# Patient Record
Sex: Female | Born: 1965 | Race: White | Hispanic: No | Marital: Single | Smoking: Never smoker
Health system: Southern US, Community
[De-identification: ages and names within clinical notes are randomized; demographics above are authoritative.]

## PROBLEM LIST (undated history)

## (undated) DIAGNOSIS — F419 Anxiety disorder, unspecified: Secondary | ICD-10-CM

## (undated) HISTORY — DX: Anxiety disorder, unspecified: F41.9

## (undated) HISTORY — PX: CHOLECYSTECTOMY: SHX55

## (undated) HISTORY — PX: BREAST CYST ASPIRATION: SHX578

---

## 1999-04-09 ENCOUNTER — Inpatient Hospital Stay (HOSPITAL_COMMUNITY): Admission: AD | Admit: 1999-04-09 | Discharge: 1999-04-12 | Payer: Self-pay | Admitting: Obstetrics and Gynecology

## 1999-05-24 ENCOUNTER — Other Ambulatory Visit: Admission: RE | Admit: 1999-05-24 | Discharge: 1999-05-24 | Payer: Self-pay | Admitting: Obstetrics and Gynecology

## 2000-08-25 ENCOUNTER — Other Ambulatory Visit: Admission: RE | Admit: 2000-08-25 | Discharge: 2000-08-25 | Payer: Self-pay | Admitting: *Deleted

## 2001-01-01 ENCOUNTER — Encounter: Admission: RE | Admit: 2001-01-01 | Discharge: 2001-01-01 | Payer: Self-pay | Admitting: Obstetrics and Gynecology

## 2001-01-01 ENCOUNTER — Encounter: Payer: Self-pay | Admitting: Obstetrics and Gynecology

## 2001-02-02 ENCOUNTER — Encounter: Payer: Self-pay | Admitting: Obstetrics and Gynecology

## 2001-02-02 ENCOUNTER — Ambulatory Visit (HOSPITAL_COMMUNITY): Admission: RE | Admit: 2001-02-02 | Discharge: 2001-02-02 | Payer: Self-pay | Admitting: Obstetrics and Gynecology

## 2002-03-15 ENCOUNTER — Inpatient Hospital Stay (HOSPITAL_COMMUNITY): Admission: AD | Admit: 2002-03-15 | Discharge: 2002-03-17 | Payer: Self-pay | Admitting: Obstetrics and Gynecology

## 2003-03-22 ENCOUNTER — Other Ambulatory Visit: Admission: RE | Admit: 2003-03-22 | Discharge: 2003-03-22 | Payer: Self-pay | Admitting: Obstetrics and Gynecology

## 2003-11-08 ENCOUNTER — Ambulatory Visit (HOSPITAL_COMMUNITY): Admission: RE | Admit: 2003-11-08 | Discharge: 2003-11-08 | Payer: Self-pay | Admitting: Obstetrics and Gynecology

## 2003-11-14 ENCOUNTER — Inpatient Hospital Stay (HOSPITAL_COMMUNITY): Admission: AD | Admit: 2003-11-14 | Discharge: 2003-11-17 | Payer: Self-pay | Admitting: Obstetrics and Gynecology

## 2003-12-08 ENCOUNTER — Encounter: Admission: RE | Admit: 2003-12-08 | Discharge: 2004-01-07 | Payer: Self-pay | Admitting: Obstetrics and Gynecology

## 2004-01-08 ENCOUNTER — Encounter: Admission: RE | Admit: 2004-01-08 | Discharge: 2004-02-07 | Payer: Self-pay | Admitting: Obstetrics and Gynecology

## 2004-06-06 ENCOUNTER — Other Ambulatory Visit: Admission: RE | Admit: 2004-06-06 | Discharge: 2004-06-06 | Payer: Self-pay | Admitting: Obstetrics and Gynecology

## 2005-07-11 ENCOUNTER — Ambulatory Visit: Payer: Self-pay | Admitting: Cardiology

## 2005-10-24 ENCOUNTER — Emergency Department (HOSPITAL_COMMUNITY): Admission: EM | Admit: 2005-10-24 | Discharge: 2005-10-24 | Payer: Self-pay | Admitting: Emergency Medicine

## 2010-06-17 ENCOUNTER — Encounter: Payer: Self-pay | Admitting: Cardiology

## 2010-06-17 ENCOUNTER — Encounter (INDEPENDENT_AMBULATORY_CARE_PROVIDER_SITE_OTHER): Payer: Self-pay | Admitting: Cardiology

## 2010-06-17 DIAGNOSIS — R002 Palpitations: Secondary | ICD-10-CM

## 2010-06-19 ENCOUNTER — Encounter: Payer: Self-pay | Admitting: Cardiology

## 2010-06-25 NOTE — Assessment & Plan Note (Signed)
Summary: heart flutters 2 weeks/pt [phone]/[phone]   Visit Type:  re establish Primary Provider:  Dr. Orson Gear  CC:  flutters, sob at times, and pt has no other complaints today.Catherine Duncan  History of Present Illness: Catherine Duncan comes in today self referred for recurrent palpitations. I evaluated her initially i 2004. Echo at that time was normal except for mild LVH. Reassurance given. She returned in 2007 on Atenolol and was doing well. She has a short PR but no hx of SVT.  A few weeks ago she began to have brief spontaneous palpitations with no other sxs. This week they are better. They are not sustained. She drinks 2 cups of caffeinated coffee q am. She does not exercise on a regular basis.  Current Medications (verified): 1)  Venlafaxine Hcl 75 Mg Tabs (Venlafaxine Hcl) .Catherine Duncan.. 1 Tab Qd  Allergies (verified): No Known Drug Allergies  Past History:  Past Medical History: Last updated: 06/14/2010  LV hypertrophy (ideopathic)  Social History: Last updated: 06/14/2010  non-smoker, non-drinker, no drug abuse, married  Review of Systems       negative other than HPI  Vital Signs:  Patient profile:   45 year old female Height:      64 inches Weight:      137.75 pounds BMI:     23.73 Pulse rate:   74 / minute Resp:     18 per minute BP sitting:   106 / 68  (left arm) Cuff size:   large  Vitals Entered By: Celestia Khat, CMA (June 17, 2010 4:32 PM)  Physical Exam  General:  Well developed, well nourished, in no acute distress. Head:  normocephalic and atraumatic Eyes:  PERRLA/EOM intact; conjunctiva and lids normal. Neck:  Neck supple, no JVD. No masses, thyromegaly or abnormal cervical nodes. Chest Almendra Loria:  no deformities or breast masses noted Lungs:  Clear bilaterally to auscultation and percussion. Heart:  Non-displaced PMI, chest non-tender; regular rate and rhythm, S1, S2 without murmurs, rubs or gallops. Carotid upstroke normal, no bruit. Normal abdominal aortic  size, no bruits. Femorals normal pulses, no bruits. Pedals normal pulses. No edema, no varicosities. Abdomen:  Bowel sounds positive; abdomen soft and non-tender without masses, organomegaly, or hernias noted. No hepatosplenomegaly. Msk:  Back normal, normal gait. Muscle strength and tone normal. Pulses:  pulses normal in all 4 extremities Extremities:  No clubbing or cyanosis. Neurologic:  Alert and oriented x 3. Skin:  Intact without lesions or rashes. Psych:  Normal affect.   Problems:  Medical Problems Added: 1)  Dx of Palpitations  (ICD-785.1)  Impression & Recommendations:  Problem # 1:  PALPITATIONS (ICD-785.1) Assessment Deteriorated Sound benign, decrease caffeine, exercise, and we will check TSH, Mg, and K. Advised about SVT and how to respond. The following medications were removed from the medication list:    Atenolol 25 Mg Tabs (Atenolol) ..... Once daily  Patient Instructions: 1)  Your physician recommends that you schedule a follow-up appointment as needed with Dr. Daleen Squibb 2)  Your physician recommends that you continue on your current medications as directed. Please refer to the Current Medication list given to you today. 3)  Have lab work drawn  at primary care office

## 2010-07-01 ENCOUNTER — Telehealth: Payer: Self-pay | Admitting: Cardiology

## 2010-07-11 NOTE — Progress Notes (Signed)
Summary: blood work results  Phone Note Call from Patient Call back at [employer] [phone] Call back at [phone]   Caller: Patient Reason for Call: Talk to Nurse, Lab or Test Results Summary of Call: blood work results Initial call taken by: Roe Coombs,  July 01, 2010 9:18 AM  Follow-up for Phone Call        Phone Call Completed PT AWARE OFLAB RESULTS. Follow-up by: Scherrie Bateman, LPN,  July 01, 2010 9:48 AM

## 2010-12-02 ENCOUNTER — Other Ambulatory Visit: Payer: Self-pay | Admitting: Obstetrics and Gynecology

## 2011-12-03 ENCOUNTER — Other Ambulatory Visit: Payer: Self-pay | Admitting: Obstetrics and Gynecology

## 2013-03-09 ENCOUNTER — Other Ambulatory Visit: Payer: Self-pay | Admitting: Obstetrics and Gynecology

## 2014-03-13 ENCOUNTER — Other Ambulatory Visit: Payer: Self-pay | Admitting: Obstetrics and Gynecology

## 2014-03-14 LAB — CYTOLOGY - PAP

## 2016-04-01 ENCOUNTER — Other Ambulatory Visit: Payer: Self-pay | Admitting: Obstetrics and Gynecology

## 2016-04-02 LAB — CYTOLOGY - PAP

## 2017-01-01 ENCOUNTER — Encounter: Payer: Self-pay | Admitting: Cardiology

## 2017-01-01 ENCOUNTER — Ambulatory Visit (INDEPENDENT_AMBULATORY_CARE_PROVIDER_SITE_OTHER): Payer: 59 | Admitting: Cardiology

## 2017-01-01 ENCOUNTER — Ambulatory Visit: Payer: 59 | Admitting: Internal Medicine

## 2017-01-01 VITALS — BP 110/68 | HR 80 | Resp 12 | Ht 64.0 in | Wt 134.0 lb

## 2017-01-01 DIAGNOSIS — R079 Chest pain, unspecified: Secondary | ICD-10-CM | POA: Diagnosis not present

## 2017-01-01 DIAGNOSIS — Z789 Other specified health status: Secondary | ICD-10-CM

## 2017-01-01 DIAGNOSIS — R21 Rash and other nonspecific skin eruption: Secondary | ICD-10-CM | POA: Diagnosis not present

## 2017-01-01 DIAGNOSIS — R0789 Other chest pain: Secondary | ICD-10-CM

## 2017-01-01 NOTE — Patient Instructions (Addendum)
Medication Instructions:  Your physician recommends that you continue on your current medications as directed. Please refer to the Current Medication list given to you today.  Labwork: Sed rate   Testing/Procedures:  Your physician has requested that you have an echocardiogram. Echocardiography is a painless test that uses sound waves to create images of your heart. It provides your doctor with information about the size and shape of your heart and how well your heart's chambers and valves are working. This procedure takes approximately one hour. There are no restrictions for this procedure.   Follow-Up: Your physician recommends that you schedule a follow-up appointment in: 4 weeks   Any Other Special Instructions Will Be Listed Below (If Applicable).  Please note that any paperwork needing to be filled out by the provider will need to be addressed at the front desk prior to seeing the provider. Please note that any paperwork FMLA, Disability or other documents regarding health condition is subject to a $25.00 charge that must be received prior to completion of paperwork in the form of a money order or check.    If you need a refill on your cardiac medications before your next appointment, please call your pharmacy.

## 2017-01-01 NOTE — Progress Notes (Signed)
Cardiology Consultation:    Date:  01/01/2017   ID:  Ozzie Hoyle, DOB [birth year], MRN [id number]  PCP:  Marylen Ponto, MD  Cardiologist:  Gypsy Balsam, MD   Referring MD: No ref. provider found   Chief Complaint  Patient presents with  . Chest Pain    Has left ventriaular regurgetation  I think to have pericarditis  History of Present Illness:    Catherine Duncan is a 51 y.o. female who is being seen today for the evaluation of atypical chest pain at the request of No ref. provider found. 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 She developed some rash, also started having some atypical chest pain. She described 2 episodes of pain like that. Pain typically happen at rest it stopping sharp like leaning for doing this pain worsethere is no swelling no sweating associated with this sensation but she described some ski beats that happening every few days, she described having what she called fluttering in her chest. Denies having any exertional tightness squeezing pressure burning chest, no swelling of lower Extremities. Denies having any fever or chills. She does not have any heart trouble. She was told years ago that she does have left ventricle hypertrophy. She is not sure how s diagnosis has been established, shes not sure what is the significance of it.  Past Medical History:  Diagnosis Date  . Anxiety     Past Surgical History:  Procedure Laterality Date  . CHOLECYSTECTOMY      Current Medications: No outpatient prescriptions have been marked as taking for the 01/01/17 encounter (Office Visit) with Georgeanna Lea, MD.     Allergies:   Patient has no known allergies.   Social History   Social History  . Marital status: Single    Spouse name: N/A  . Number of children: N/A  . Years of education: N/A   Social History Main Topics  . Smoking status:  Never Smoker  . Smokeless tobacco: Never Used  . Alcohol use No  . Drug use: No  . Sexual activity: Not Asked   Other Topics Concern  . None   Social History Narrative  . None     Family History: The patient's family history is not on file. ROS:   Please see the history of present illness.    All 14 point review of systems negative except as described per history of present illness.  EKGs/Labs/Other Studies Reviewed:    The following studies were reviewed today:   EKG:  EKG is  ordered today.  The ekg ordered today demonstrates normal sinus rhythm, normal. Interval, normal wrist complex duration morphology, no ST-T segment changes  Recent Labs: No results found for requested labs within last 8760 hours.  Recent Lipid Panel No results found for: CHOL, TRIG, HDL, CHOLHDL, VLDL, LDLCALC, LDLDIRECT  Physical Exam:    VS:  BP 110/68   Pulse 80   Resp 12   Ht  (1.626 m)   Wt 134 lb (60.8 kg)   BMI 23.00 kg/m     Wt Readings from Last 3 Encounters:  01/01/17 134 lb (60.8 kg)  06/17/10 137 lb 12 oz (62.5 kg)     GEN:  Well nourished, well developed in no acute distress HEENT: Normal NECK: No JVD; No carotid bruits LYMPHATICS: No lymphadenopathy CARDIAC: RRR, no murmurs, no rubs,  no gallops, no rub RESPIRATORY:  Clear to auscultation without rales, wheezing or rhonchi  ABDOMEN: Soft, non-tender, non-distended MUSCULOSKELETAL:  No edema; No deformity  SKIN: Warm and dry NEUROLOGIC:  Alert and oriented x 3 PSYCHIATRIC:  Normal affect   ASSESSMENT:    1. Chest pain, unspecified type   2. Atypical chest pain    PLAN:    In order of problems listed above:  1. Chest pain with some pleuritic complement. She suspects she does have pericarditis which is a possibility.I will ask her to have echocardiogram to assess left ventricular ejection fraction, to assess left ventricle hypertrophy did apparently she has as wellsked check for pericardial effusion and check  for pericardial effusion. 2. I will also ask her to have sedimentation rate checked. 3. Because of some rash that she develops I think she need to see infectious disease specialist with expertise in tropical diseases. 4. I warned her about signs and symptoms of worsening of pericarditis is if he truly does have 1. That is swelling of lower extremities palpitations shortness of breath. Otherwise I see him back in my office about 3-4 weeks.   Medication Adjustments/Labs and Tests Ordered: Current medicines are reviewed at length with the patient today.  Concerns regarding medicines are outlined above.  Orders Placed This Encounter  Procedures  . EKG 12-Lead   No orders of the defined types were placed in this encounter.   Signed, Georgeanna Lea, MD, [facility]. 01/01/2017 9:26 AM    [facility]

## 2017-01-02 LAB — SEDIMENTATION RATE: Sed Rate: 2 mm/hr (ref 0–32)

## 2017-01-05 ENCOUNTER — Ambulatory Visit (HOSPITAL_BASED_OUTPATIENT_CLINIC_OR_DEPARTMENT_OTHER)
Admission: RE | Admit: 2017-01-05 | Discharge: 2017-01-05 | Disposition: A | Discharge status: Home / Self Care | Payer: 59 | Source: Ambulatory Visit | Attending: Cardiology | Admitting: Cardiology

## 2017-01-05 DIAGNOSIS — R0789 Other chest pain: Secondary | ICD-10-CM

## 2017-01-05 NOTE — Progress Notes (Signed)
  Echocardiogram 2D Echocardiogram has been performed.  Catherine Duncan 01/05/2017, 9:13 AM

## 2017-01-20 ENCOUNTER — Ambulatory Visit (INDEPENDENT_AMBULATORY_CARE_PROVIDER_SITE_OTHER): Payer: 59 | Admitting: Internal Medicine

## 2017-01-20 ENCOUNTER — Encounter: Payer: Self-pay | Admitting: Internal Medicine

## 2017-01-20 VITALS — BP 103/66 | HR 77 | Temp 98.6°F | Ht 64.0 in | Wt 135.0 lb

## 2017-01-20 DIAGNOSIS — K121 Other forms of stomatitis: Secondary | ICD-10-CM | POA: Diagnosis not present

## 2017-01-20 DIAGNOSIS — Z23 Encounter for immunization: Secondary | ICD-10-CM | POA: Diagnosis not present

## 2017-01-20 DIAGNOSIS — R509 Fever, unspecified: Secondary | ICD-10-CM

## 2017-01-20 DIAGNOSIS — Z9189 Other specified personal risk factors, not elsewhere classified: Secondary | ICD-10-CM

## 2017-01-20 NOTE — Progress Notes (Signed)
New referral from cardiology for FUO  Patient ID: Catherine Duncan, female   DOB: 03-26-1966, 51 y.o.   MRN: [id number]  HPI  51yo F in good state of health who was given diagnosis of roughly 15 yr ago, LVH. In setting of having symptomatic fluttering and murmur on exam. She has been going to [place] on an annual basis for [employer] trips. 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 No one else by sick. Started having fever, and sore throat the day prior to leaving. And had blisters in throat.  5 days of fever but felt ill for 10 days. Then rash on cheeks on buttocks - still persisting 2 months.Was empirically treated for strep throat august 8th by pcp due to . Started blisters in mouth on the 10th. Gave magic mouth wash helped with symptoms.  She then had Sharp chest pain 2  Episodes that lasted one hour at the end of august/beginning of September.  No muscle pain, joint pain.   Chronic back pain, not worse, no neck pain, ? Headache - not unusual.   Daughter having shingles now on chronic valacyclovir. She feels like her blisters come back at times and taking her daughters valtrex which helps her.  Has seen dermatologist who gave mupirocin - for rash which has +/- helped  She says she is uptodate on travel vaccines, and took malarone for malaria proph   Outpatient Encounter Prescriptions as of 01/20/2017  Medication Sig  . citalopram (CELEXA) 10 MG tablet Take 1 tablet by mouth daily.   No facility-administered encounter medications on file as of 01/20/2017.      Patient Active Problem List   Diagnosis Date Noted  . Atypical chest pain 01/01/2017  . PALPITATIONS 06/17/2010     Health Maintenance Due  Topic Date Due  . HIV Screening  06/04/1982  . TETANUS/TDAP  06/04/1986  . INFLUENZA VACCINE   11/12/2016    Social History  Substance Use Topics  . Smoking status: Never Smoker  . Smokeless tobacco: Never Used  . Alcohol use No  family history is not on file. Review of Systems Per hpi, rash+, otherwise 12 point ros is negative Physical Exam   BP 103/66   Pulse 77   Temp 98.6 F (37 C) (Oral)   Ht  (1.626 m)   Wt 135 lb (61.2 kg)   BMI 23.17 kg/m   Physical Exam  Constitutional:  oriented to person, place, and time. appears well-developed and well-nourished. No distress.  HENT: [place]/AT, PERRLA, no scleral icterus Mouth/Throat: Oropharynx is clear and moist. No oropharyngeal exudate. No lesions in  Cardiovascular: Normal rate, regular rhythm and normal heart sounds. Exam reveals no gallop and no friction rub.  No murmur heard.  Pulmonary/Chest: Effort normal and breath sounds normal. No respiratory distress.  has no wheezes.  Neck = supple, no nuchal rigidity Abdominal: Soft. Bowel sounds are normal.  exhibits no distension. There is no tenderness.  Lymphadenopathy: no cervical adenopathy. No axillary adenopathy Neurological: alert and oriented to person, place, and time.  Skin: Skin is warm and dry. Non blanching erythamatous macule, nearly pinpoint on left buttock. Appears residual scarring Psychiatric: a normal mood and affect.  behavior is normal.    Assessment and Plan  Febrile illness post travel = appears to  be resolved. Clinical history suggests being viral. Lack of myalgia, arthralgias make me think less likely [place] but could be minor. 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 Will send rx for valtrex to use for pre-emptive treatment

## 2017-01-20 NOTE — Patient Instructions (Signed)
Give [place] a call if you would like [place] to prepare you for your next trip to [place]

## 2017-01-21 LAB — HSV 1 ANTIBODY, IGG: HSV 1 GLYCOPROTEIN G AB, IGG: 2.78 {index} — AB

## 2017-01-21 LAB — HSV 2 ANTIBODY, IGG

## 2017-01-22 ENCOUNTER — Other Ambulatory Visit: Payer: Self-pay | Admitting: *Deleted

## 2017-01-22 DIAGNOSIS — B009 Herpesviral infection, unspecified: Secondary | ICD-10-CM

## 2017-01-22 MED ORDER — VALACYCLOVIR HCL 1 G PO TABS: 1000.0000 mg | ORAL_TABLET | Freq: Two times a day (BID) | ORAL | 2 refills | Status: DC

## 2017-01-22 MED ORDER — ACYCLOVIR 400 MG PO TABS: 400.0000 mg | ORAL_TABLET | Freq: Three times a day (TID) | ORAL | 11 refills | Status: DC

## 2017-01-23 ENCOUNTER — Ambulatory Visit: Payer: 59 | Admitting: Interventional Cardiology

## 2017-02-04 ENCOUNTER — Ambulatory Visit: Payer: 59 | Admitting: Cardiology

## 2017-05-06 ENCOUNTER — Other Ambulatory Visit: Payer: Self-pay | Admitting: Obstetrics and Gynecology

## 2017-05-06 DIAGNOSIS — R928 Other abnormal and inconclusive findings on diagnostic imaging of breast: Secondary | ICD-10-CM

## 2017-05-13 ENCOUNTER — Ambulatory Visit
Admission: RE | Admit: 2017-05-13 | Discharge: 2017-05-13 | Disposition: A | Discharge status: Home / Self Care | Payer: 59 | Source: Ambulatory Visit | Attending: Obstetrics and Gynecology | Admitting: Obstetrics and Gynecology

## 2017-05-13 DIAGNOSIS — R928 Other abnormal and inconclusive findings on diagnostic imaging of breast: Secondary | ICD-10-CM

## 2017-06-22 ENCOUNTER — Other Ambulatory Visit: Payer: Self-pay | Admitting: *Deleted

## 2017-06-22 DIAGNOSIS — B009 Herpesviral infection, unspecified: Secondary | ICD-10-CM

## 2017-06-22 MED ORDER — ACYCLOVIR 400 MG PO TABS: 400.0000 mg | ORAL_TABLET | Freq: Every day | ORAL | 5 refills | Status: AC

## 2018-05-11 ENCOUNTER — Other Ambulatory Visit: Payer: Self-pay | Admitting: Obstetrics and Gynecology

## 2018-05-13 ENCOUNTER — Other Ambulatory Visit: Payer: Self-pay | Admitting: Obstetrics and Gynecology

## 2018-05-13 DIAGNOSIS — N6320 Unspecified lump in the left breast, unspecified quadrant: Secondary | ICD-10-CM

## 2018-05-20 ENCOUNTER — Ambulatory Visit
Admission: RE | Admit: 2018-05-20 | Discharge: 2018-05-20 | Disposition: A | Discharge status: Home / Self Care | Payer: 59 | Source: Ambulatory Visit | Attending: Obstetrics and Gynecology | Admitting: Obstetrics and Gynecology

## 2018-05-20 DIAGNOSIS — N6320 Unspecified lump in the left breast, unspecified quadrant: Secondary | ICD-10-CM

## 2018-07-18 ENCOUNTER — Other Ambulatory Visit: Payer: Self-pay | Admitting: Internal Medicine

## 2018-07-18 DIAGNOSIS — B009 Herpesviral infection, unspecified: Secondary | ICD-10-CM

## 2021-04-16 ENCOUNTER — Other Ambulatory Visit: Payer: Self-pay | Admitting: Obstetrics and Gynecology

## 2021-04-16 DIAGNOSIS — Z1231 Encounter for screening mammogram for malignant neoplasm of breast: Secondary | ICD-10-CM

## 2021-06-11 ENCOUNTER — Ambulatory Visit
Admission: RE | Admit: 2021-06-11 | Discharge: 2021-06-11 | Disposition: A | Discharge status: Home / Self Care | Payer: 59 | Source: Ambulatory Visit | Attending: Obstetrics and Gynecology | Admitting: Obstetrics and Gynecology

## 2021-06-11 DIAGNOSIS — Z1231 Encounter for screening mammogram for malignant neoplasm of breast: Secondary | ICD-10-CM

## 2022-01-29 ENCOUNTER — Other Ambulatory Visit: Payer: Self-pay | Admitting: Obstetrics and Gynecology

## 2022-01-29 DIAGNOSIS — N644 Mastodynia: Secondary | ICD-10-CM

## 2022-03-12 ENCOUNTER — Ambulatory Visit
Admission: RE | Admit: 2022-03-12 | Discharge: 2022-03-12 | Disposition: A | Discharge status: Home / Self Care | Payer: 59 | Source: Ambulatory Visit | Attending: Obstetrics and Gynecology | Admitting: Obstetrics and Gynecology

## 2022-03-12 DIAGNOSIS — N644 Mastodynia: Secondary | ICD-10-CM

## 2022-06-19 ENCOUNTER — Other Ambulatory Visit: Payer: Self-pay | Admitting: Obstetrics and Gynecology

## 2022-06-19 DIAGNOSIS — R928 Other abnormal and inconclusive findings on diagnostic imaging of breast: Secondary | ICD-10-CM

## 2022-06-23 ENCOUNTER — Ambulatory Visit
Admission: RE | Admit: 2022-06-23 | Discharge: 2022-06-23 | Disposition: A | Discharge status: Home / Self Care | Payer: 59 | Source: Ambulatory Visit | Attending: Obstetrics and Gynecology | Admitting: Obstetrics and Gynecology

## 2022-06-23 ENCOUNTER — Other Ambulatory Visit: Payer: Self-pay | Admitting: Obstetrics and Gynecology

## 2022-06-23 DIAGNOSIS — N6310 Unspecified lump in the right breast, unspecified quadrant: Secondary | ICD-10-CM

## 2022-06-23 DIAGNOSIS — R928 Other abnormal and inconclusive findings on diagnostic imaging of breast: Secondary | ICD-10-CM

## 2022-10-29 ENCOUNTER — Other Ambulatory Visit: Payer: 59

## 2022-11-28 ENCOUNTER — Other Ambulatory Visit: Payer: 59

## 2022-12-25 ENCOUNTER — Other Ambulatory Visit: Payer: Self-pay | Admitting: Obstetrics and Gynecology

## 2022-12-25 ENCOUNTER — Ambulatory Visit
Admission: RE | Admit: 2022-12-25 | Discharge: 2022-12-25 | Disposition: A | Discharge status: Home / Self Care | Payer: 59 | Source: Ambulatory Visit | Attending: Obstetrics and Gynecology | Admitting: Obstetrics and Gynecology

## 2022-12-25 DIAGNOSIS — N6310 Unspecified lump in the right breast, unspecified quadrant: Secondary | ICD-10-CM

## 2022-12-25 DIAGNOSIS — R928 Other abnormal and inconclusive findings on diagnostic imaging of breast: Secondary | ICD-10-CM

## 2023-06-25 ENCOUNTER — Ambulatory Visit
Admission: RE | Admit: 2023-06-25 | Discharge: 2023-06-25 | Disposition: A | Discharge status: Home / Self Care | Payer: 59 | Source: Ambulatory Visit | Attending: Obstetrics and Gynecology | Admitting: Obstetrics and Gynecology

## 2023-06-25 DIAGNOSIS — N6310 Unspecified lump in the right breast, unspecified quadrant: Secondary | ICD-10-CM

## 2023-09-25 ENCOUNTER — Ambulatory Visit (INDEPENDENT_AMBULATORY_CARE_PROVIDER_SITE_OTHER): Admitting: Podiatry

## 2023-09-25 ENCOUNTER — Ambulatory Visit (INDEPENDENT_AMBULATORY_CARE_PROVIDER_SITE_OTHER)

## 2023-09-25 DIAGNOSIS — M21612 Bunion of left foot: Secondary | ICD-10-CM

## 2023-09-25 DIAGNOSIS — M6740 Ganglion, unspecified site: Secondary | ICD-10-CM

## 2023-09-25 DIAGNOSIS — M21611 Bunion of right foot: Secondary | ICD-10-CM

## 2023-09-25 DIAGNOSIS — M21619 Bunion of unspecified foot: Secondary | ICD-10-CM | POA: Diagnosis not present

## 2023-09-25 NOTE — Progress Notes (Unsigned)
      Chief Complaint  Patient presents with   Bunions    Bilateral bunions with the right foot being the more painful. She also has a blister like spot on the right 3rd toe, that feels solid. Not diabetic and no anti coag.    HPI: 58 y.o. female presents today for bunion evaluation.  She also notes a bump on the right third toe.  Denies pain  Past Medical History:  Diagnosis Date   Anxiety     Past Surgical History:  Procedure Laterality Date   BREAST CYST ASPIRATION     CHOLECYSTECTOMY     No Known Allergies   PHYSICAL EXAM:  General: The patient is alert and oriented x3 in no acute distress.  Dermatology: Skin is warm, dry and supple bilateral lower extremities. Interspaces are clear of maceration and debris.  No rashes noted.  There is a digital mucoid cyst noted on the dorsal aspect of the right third toe slightly off center at the DIPJ near the cuticle.  There is no pain on palpation.  This is raised approximately 2 mm from the skin.  No clinical signs of infection are noted.  Vascular: Palpable pedal pulses bilaterally. Capillary refill within normal limits.  No appreciable edema.  No erythema or calor.  Neurological: Light touch sensation grossly intact bilateral feet.   Musculoskeletal Exam:  There is a bony medial prominence on the dorsomedial aspect of the 1st metatarsal head of the bilateral foot.  There is pain on palpation of the bump in this area.  Lateral deviation of the hallux at the MPJ level.  1st MPJ ROM is decreased.  No crepitus.  Hallux is tracking, not trackbound.    RADIOGRAPHIC EXAM (bilateral foot, 3 weightbearing views, 09/25/2023):  Increased first intermetatarsal angle approximately 11 degrees on the right.  Increased hallux abductus angle.  Enlargement of bone at dorsomedial 1st metatarsal head.  Joint space is preserved.  Bipartite tibial sesamoid on the right.  Joint space narrowing at the right third toe DIPJ The left foot first IM angle is  approximately 11 degrees.  Increased hallux abductus angle.  ASSESSMENT/PLAN OF CARE: 1. Bunion   2. Bunion, right foot   3. Bunion, left foot   4. Mucoid cyst, joint    Discussed patient's conditions and possible etiologies today.  Discussed conservative treatment options with patient today, including shoe modification / arch supports, off-loading, cortisone injectione, NSAID topical / oral therapy, and toe splints and shields.  Briefly discussed surgical intervention if conservative options are not successful.    She would like to try the toe alignment splints.  We are currently out of stock but will need to add her name to our list and call her once these come in.  Return if symptoms worsen or fail to improve.    Joe Murders, DPM, FACFAS [facility]     [address] [place], [place] [address]                Office [phone]  Fax [phone]

## 2023-10-08 IMAGING — MG MM DIGITAL SCREENING BILAT W/ TOMO AND CAD
8 series · 9 of 24 positions shown · non-contrast
Comparison: Previous exam(s).

CLINICAL DATA: Screening.

EXAM:
DIGITAL SCREENING BILATERAL MAMMOGRAM WITH TOMOSYNTHESIS AND CAD
TECHNIQUE: Bilateral screening digital craniocaudal and mediolateral oblique
mammograms were obtained. Bilateral screening digital breast
tomosynthesis was performed. The images were evaluated with
computer-aided detection.

[L CC synth-2D]
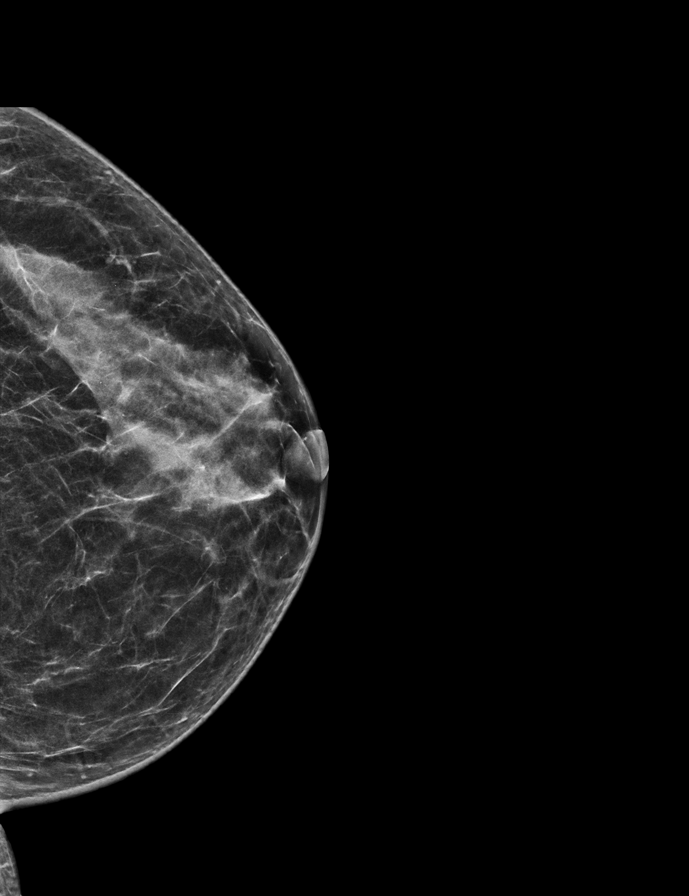

[L MLO synth-2D]
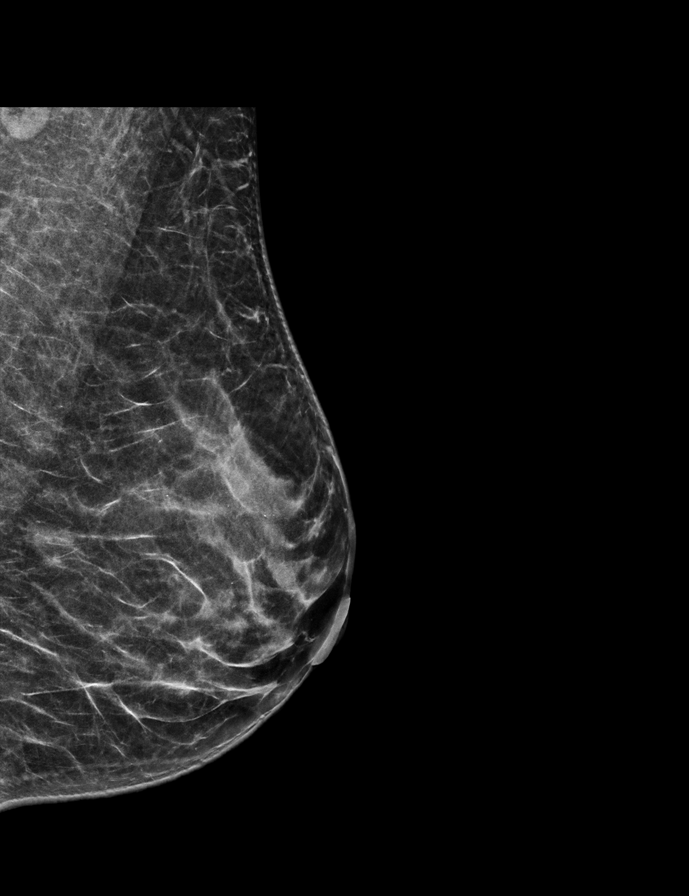

[R CC synth-2D]
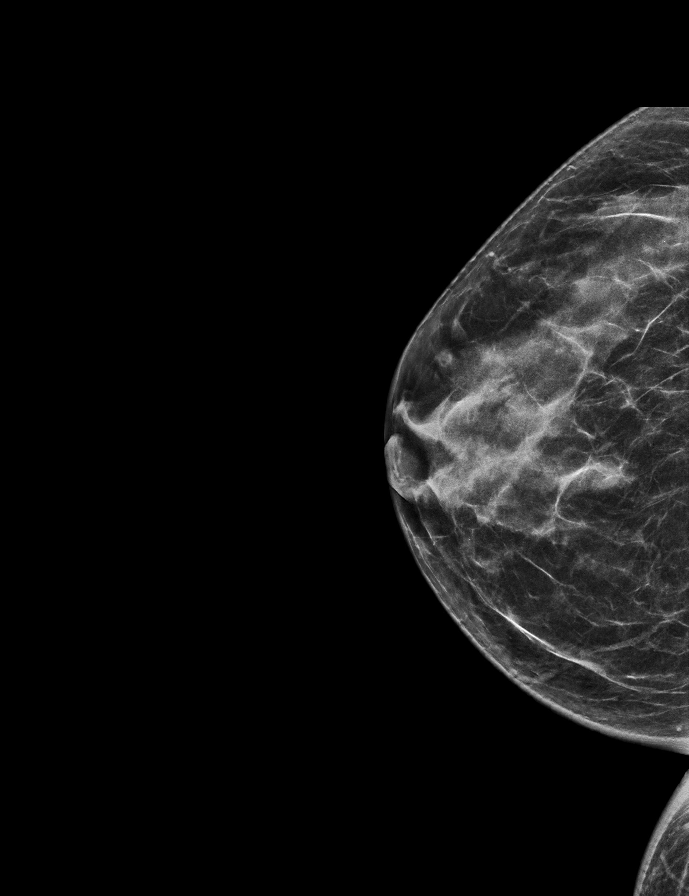

[R MLO synth-2D]
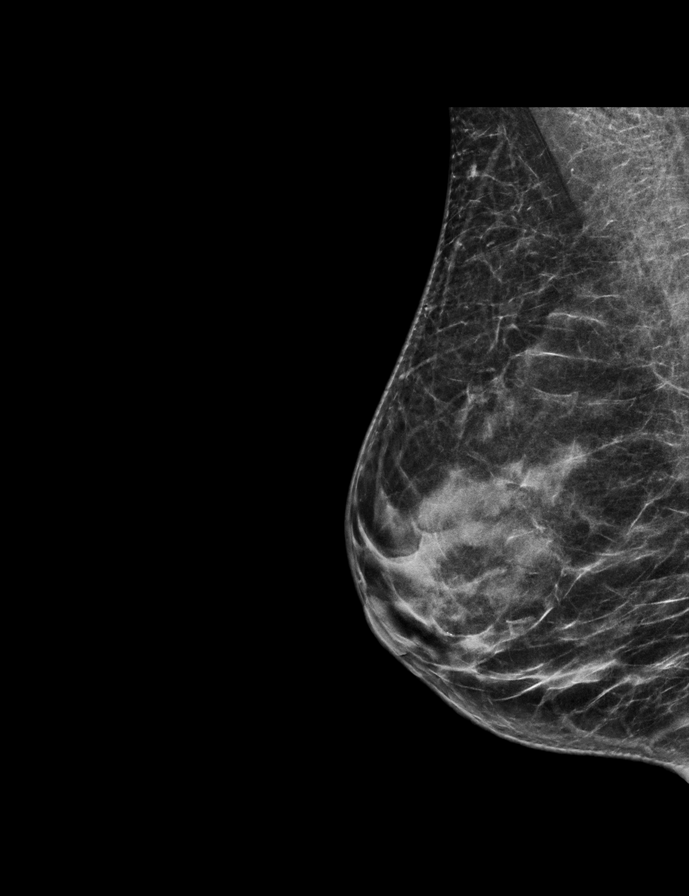

[R CC tomo · 2 of 51 frames shown]
[frame 17/51]
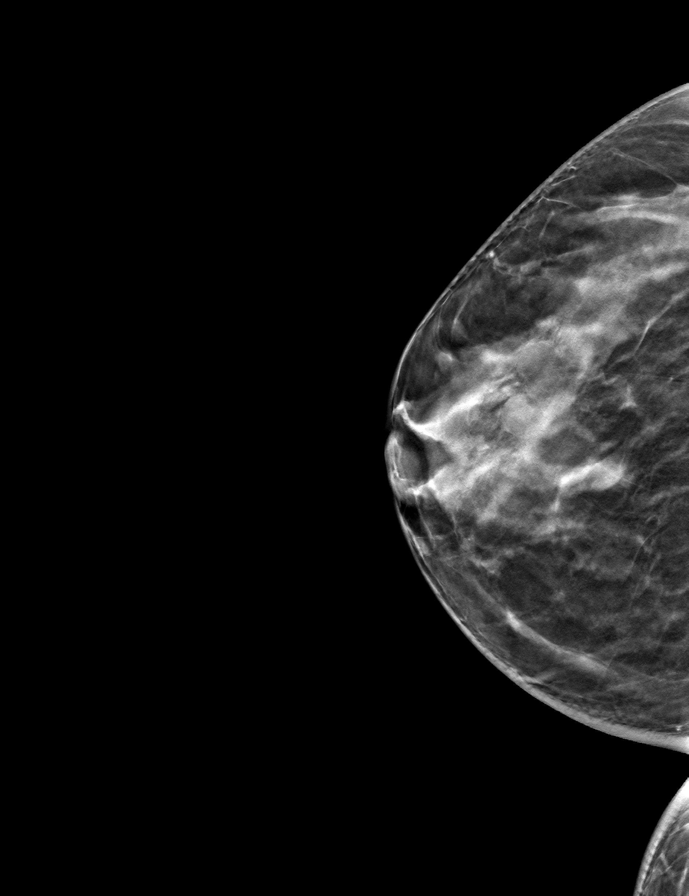
[frame 26/51]
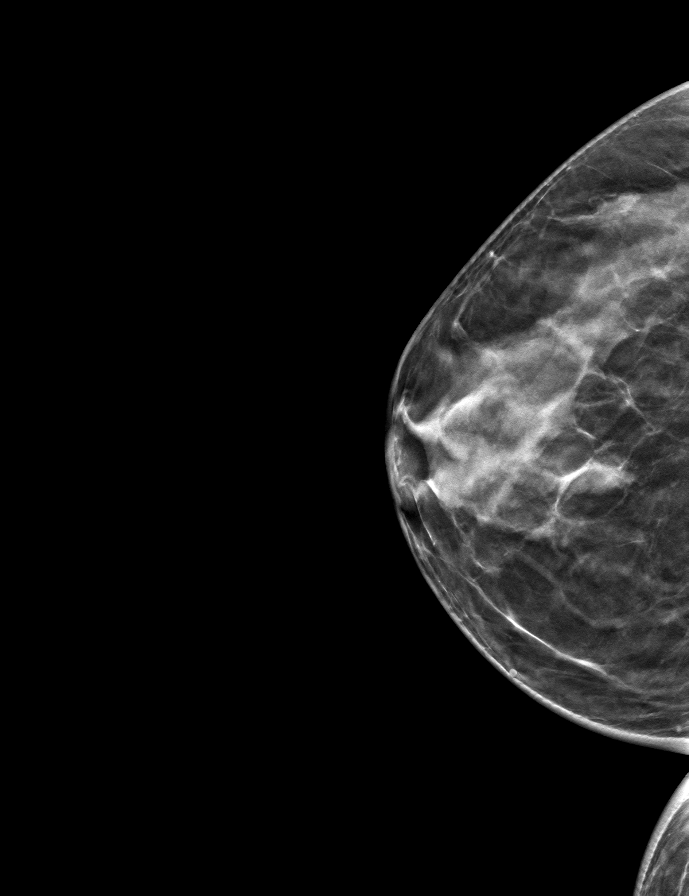

[L CC tomo · tomo slice 26/51.0]
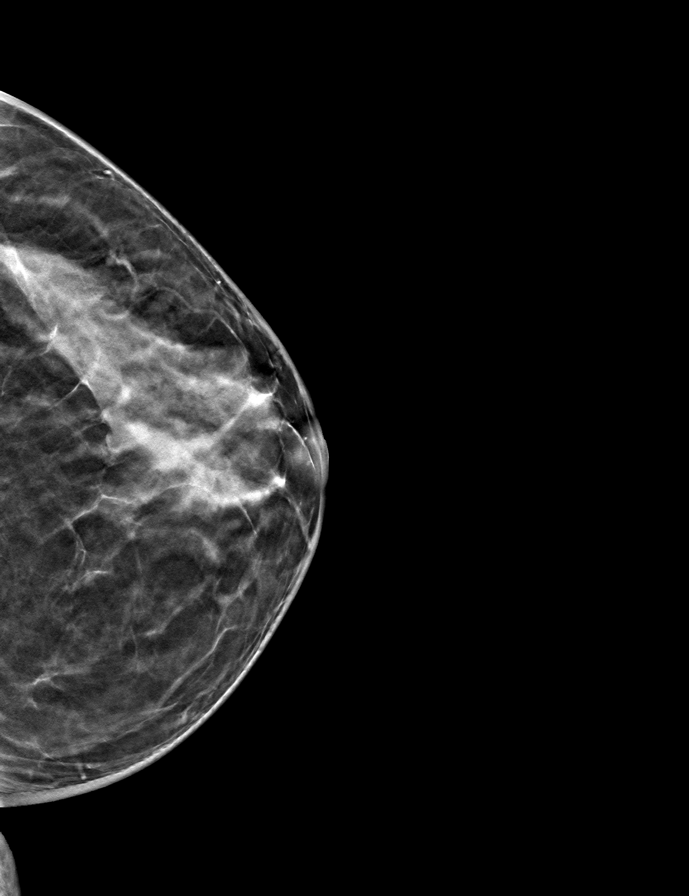

[L MLO tomo · tomo slice 27/52.0]
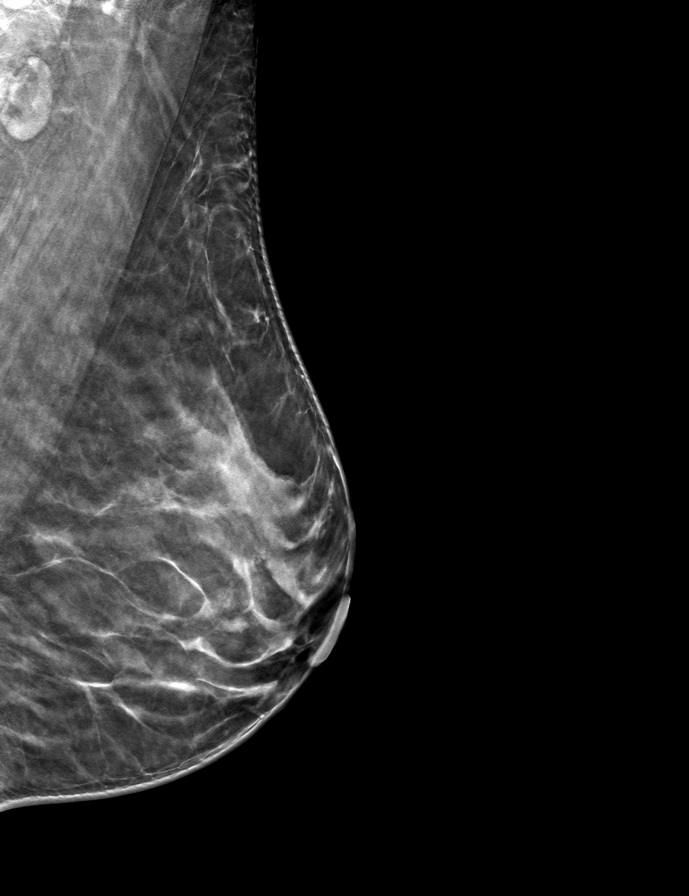

[R MLO tomo · tomo slice 26/51.0]
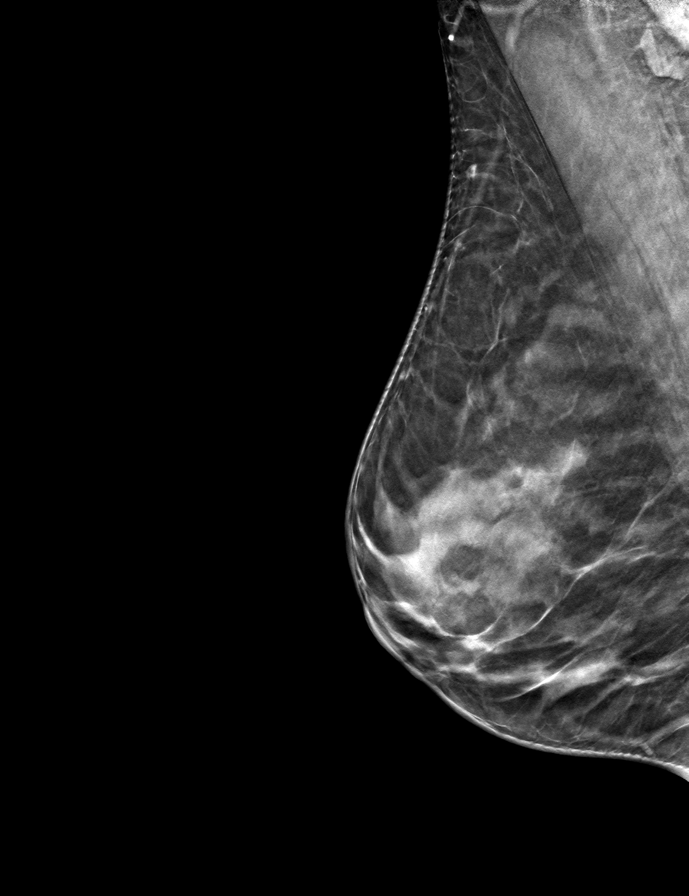

[9 of 24 positions shown; findings below may reference images not displayed]

ACR Breast Density Category c: The breast tissue is heterogeneously
dense, which may obscure small masses.
FINDINGS: There are no findings suspicious for malignancy.
IMPRESSION: No mammographic evidence of malignancy. A result letter of this
screening mammogram will be mailed directly to the patient.

RECOMMENDATION:
Screening mammogram in one year. (Code:Q3-W-BC3)

BI-RADS CATEGORY  1: Negative.

## 2023-10-21 ENCOUNTER — Telehealth: Payer: Self-pay

## 2023-11-16 NOTE — Telephone Encounter (Signed)
 Spoke with patient CT

## 2024-05-11 ENCOUNTER — Other Ambulatory Visit: Payer: Self-pay | Admitting: Obstetrics and Gynecology

## 2024-05-11 DIAGNOSIS — N63 Unspecified lump in unspecified breast: Secondary | ICD-10-CM

## 2024-06-27 ENCOUNTER — Other Ambulatory Visit

## 2024-06-27 ENCOUNTER — Encounter
